# Patient Record
Sex: Female | Born: 1995 | Race: Black or African American | Hispanic: No | Marital: Single | Smoking: Never smoker
Health system: Southern US, Community
[De-identification: ages and names within clinical notes are randomized; demographics above are authoritative.]

---

## 2016-01-29 ENCOUNTER — Encounter (HOSPITAL_COMMUNITY): Payer: Self-pay | Admitting: Emergency Medicine

## 2016-01-29 ENCOUNTER — Emergency Department (HOSPITAL_COMMUNITY)
Admission: EM | Admit: 2016-01-29 | Discharge: 2016-01-29 | Disposition: A | Discharge status: Home / Self Care | Payer: Self-pay | Attending: Emergency Medicine | Admitting: Emergency Medicine

## 2016-01-29 ENCOUNTER — Emergency Department (HOSPITAL_COMMUNITY): Payer: Self-pay

## 2016-01-29 DIAGNOSIS — G43109 Migraine with aura, not intractable, without status migrainosus: Secondary | ICD-10-CM

## 2016-01-29 DIAGNOSIS — G43809 Other migraine, not intractable, without status migrainosus: Secondary | ICD-10-CM | POA: Insufficient documentation

## 2016-01-29 LAB — I-STAT CHEM 8, ED
BUN: 11 mg/dL (ref 6–20)
CHLORIDE: 106 mmol/L (ref 101–111)
Calcium, Ion: 1.17 mmol/L (ref 1.15–1.40)
Creatinine, Ser: 0.7 mg/dL (ref 0.44–1.00)
Glucose, Bld: 96 mg/dL (ref 65–99)
HEMATOCRIT: 40 % (ref 36.0–46.0)
Hemoglobin: 13.6 g/dL (ref 12.0–15.0)
Potassium: 4.3 mmol/L (ref 3.5–5.1)
SODIUM: 142 mmol/L (ref 135–145)
TCO2: 23 mmol/L (ref 0–100)

## 2016-01-29 LAB — PREGNANCY, URINE: Preg Test, Ur: NEGATIVE

## 2016-01-29 MED ORDER — DIPHENHYDRAMINE HCL 50 MG/ML IJ SOLN
25.0000 mg | Freq: Once | INTRAMUSCULAR | Status: AC
  Administered 2016-01-29: 25 mg via INTRAVENOUS
  Filled 2016-01-29: qty 1

## 2016-01-29 MED ORDER — ONDANSETRON 4 MG PO TBDP: 4.0000 mg | ORAL_TABLET | Freq: Three times a day (TID) | ORAL | 0 refills | Status: AC | PRN

## 2016-01-29 MED ORDER — PROCHLORPERAZINE EDISYLATE 5 MG/ML IJ SOLN
10.0000 mg | Freq: Once | INTRAMUSCULAR | Status: AC
  Administered 2016-01-29: 10 mg via INTRAVENOUS
  Filled 2016-01-29: qty 2

## 2016-01-29 MED ORDER — DEXAMETHASONE SODIUM PHOSPHATE 10 MG/ML IJ SOLN
10.0000 mg | Freq: Once | INTRAMUSCULAR | Status: AC
  Administered 2016-01-29: 10 mg via INTRAVENOUS
  Filled 2016-01-29: qty 1

## 2016-01-29 MED ORDER — SODIUM CHLORIDE 0.9 % IV BOLUS (SEPSIS)
1000.0000 mL | Freq: Once | INTRAVENOUS | Status: AC
  Administered 2016-01-29: 1000 mL via INTRAVENOUS

## 2016-01-29 MED ORDER — IBUPROFEN 400 MG PO TABS: 400.0000 mg | ORAL_TABLET | Freq: Four times a day (QID) | ORAL | 0 refills | Status: AC | PRN

## 2016-01-29 NOTE — ED Triage Notes (Signed)
Pt. Stated, I started having headache this morning on the right side of my face.

## 2016-01-29 NOTE — ED Provider Notes (Signed)
[facility]-EMERGENCY DEPT Provider Note   CSN: [id number] Arrival date & time: 01/29/16  1101     History   Chief Complaint Chief Complaint  Patient presents with  . Headache  . Facial Pain    HPI Susan Rush is a 20 y.o. female.  Patient is 20 yo F with no PMH, no history of prior migraines, presenting with severe right-sided headache with right ocular pain starting at 9:00 AM. Also reports photophobia and "spots" preceding headache, but no vision changes. Symptoms have been constant throughout the day, and she tried taking OTC generic pain medicine but vomited it up. She denies ear pain, tinnitus, dizziness, numbness, weakness, or difficulty ambulating. No recent fevers, chills, neck pain, or stiffness. Denies any alcohol or drug use.      History reviewed. No pertinent past medical history.  There are no active problems to display for this patient.   History reviewed. No pertinent surgical history.  OB History    No data available       Home Medications    Prior to Admission medications   Medication Sig Start Date End Date Taking? Authorizing Provider  Acetaminophen (PAIN RELIEF PO) Take 1 tablet by mouth as needed (for pain or headache).   Yes Historical Provider, MD    Family History No family history on file.  Social History Social History  Substance Use Topics  . Smoking status: Never Smoker  . Smokeless tobacco: Never Used  . Alcohol use No     Allergies   Patient has no known allergies.   Review of Systems Review of Systems  Constitutional: Negative for chills and fever.  HENT: Negative for ear pain, sore throat and tinnitus.   Eyes: Positive for photophobia. Negative for pain and visual disturbance.  Respiratory: Negative for cough and shortness of breath.   Cardiovascular: Negative for chest pain, palpitations and leg swelling.  Gastrointestinal: Positive for vomiting. Negative for abdominal pain and blood in stool.  Genitourinary: Negative  for dysuria, flank pain and hematuria.  Musculoskeletal: Negative for back pain, neck pain and neck stiffness.  Skin: Negative for color change and rash.  Neurological: Positive for headaches. Negative for dizziness, seizures, syncope, weakness and numbness.     Physical Exam Updated Vital Signs BP 111/65 (BP Location: Right Arm)   Pulse 75   Temp 98.1 F (36.7 C) (Oral)   Resp 17   Ht 5' (1.524 m)   Wt 95 kg   SpO2 100%   BMI 40.92 kg/m   Physical Exam  Constitutional: She appears well-developed and well-nourished. No distress.  HENT:  Head: Normocephalic and atraumatic.  Mouth/Throat: Oropharynx is clear and moist.  TMs and ear canals normal bilaterally. No nasal mucosal edema or erythema. No frontal or maxillary sinus tenderness. No oropharyngeal exudate, erythema, or edema.  Eyes: Conjunctivae and EOM are normal.  Dilated pupils noted bilaterally, but PERRL.  Neck: Normal range of motion. Neck supple.  No nuchal rigidity or meningeal signs.  Cardiovascular: Normal rate, regular rhythm, normal heart sounds and intact distal pulses.   Pulmonary/Chest: Effort normal and breath sounds normal. No respiratory distress.  Abdominal: Soft. There is no tenderness.  Musculoskeletal: Normal range of motion. She exhibits no edema or tenderness.  Neurological: She is alert.  Speech is clear and goal oriented, follows commands. Cranial nerves III - XII without deficit, no facial droop. Normal strength in upper and lower extremities bilaterally, strong and equal grip strength. Sensation normal to light and sharp touch. Moves extremities  without ataxia, coordination intact. Normal finger to nose and rapid alternating movements. Neg Romberg, no pronator drift. Normal gait.  Skin: Skin is warm and dry.  Psychiatric: She has a normal mood and affect.  Nursing note and vitals reviewed.    ED Treatments / Results  Labs (all labs ordered are listed, but only abnormal results are  displayed) Labs Reviewed  PREGNANCY, URINE  I-STAT CHEM 8, ED    EKG  EKG Interpretation None       Radiology Ct Head Wo Contrast  Result Date: 01/29/2016 CLINICAL DATA:  Right-sided headache. EXAM: CT HEAD WITHOUT CONTRAST TECHNIQUE: Contiguous axial images were obtained from the base of the skull through the vertex without intravenous contrast. COMPARISON:  None. FINDINGS: Brain: No evidence of acute infarction, hemorrhage, hydrocephalus, extra-axial collection or mass lesion/mass effect. Vascular: No hyperdense vessel or unexpected calcification. Skull: Normal. Negative for fracture or focal lesion. Sinuses/Orbits: No acute finding. Other: None. IMPRESSION: No acute abnormalities to explain the patient's pain. Electronically Signed   By: Gerome Samavid  Williams III M.D   On: 01/29/2016 13:44    Procedures Procedures (including critical care time)  Medications Ordered in ED Medications  sodium chloride 0.9 % bolus 1,000 mL (0 mLs Intravenous Stopped 01/29/16 1420)  prochlorperazine (COMPAZINE) injection 10 mg (10 mg Intravenous Given 01/29/16 1302)  diphenhydrAMINE (BENADRYL) injection 25 mg (25 mg Intravenous Given 01/29/16 1302)  dexamethasone (DECADRON) injection 10 mg (10 mg Intravenous Given 01/29/16 1302)     Initial Impression / Assessment and Plan / ED Course  I have reviewed the triage vital signs and the nursing notes.  Pertinent labs & imaging results that were available during my care of the patient were reviewed by me and considered in my medical decision making (see chart for details).  Clinical Course    Patient is 20 yo F with no history of prior migraines, presenting with severe right-sided headache with right ocular pain starting at 9:00 AM. Patient is afebrile with no nuchal rigidity concerning for meningitis, and has completely normal neuro exam. Given severity of headache with no history of migraines, CT head ordered to r/o acute pathology including malignancy or  ICH. Patient given IVF, Zofran, and migraine cocktail. CT head showed no acute findings to explain patient's pain. On reassessment, patient's headache resolved. Lkely cluster headache or ocular migraine, and patient stable for d/c home with prescription ibuprofen and Zofran and referral to neurology for reevaluation. Return precautions discussed for new or worsening symptoms including headache, dizziness, vision changes, numbness, weakness, difficulty walking, vomiting, or any concerning neurologic symptoms.  Final Clinical Impressions(s) / ED Diagnoses   Final diagnoses:  Ocular migraine    New Prescriptions Discharge Medication List as of 01/29/2016  2:18 PM    START taking these medications   Details  ibuprofen (ADVIL,MOTRIN) 400 MG tablet Take 1 tablet (400 mg total) by mouth every 6 (six) hours as needed., Starting Sun 01/29/2016, Print    ondansetron (ZOFRAN ODT) 4 MG disintegrating tablet Take 1 tablet (4 mg total) by mouth every 8 (eight) hours as needed for nausea or vomiting., Starting Sun 01/29/2016, Print         Tarissa Kerin F de EnglevaleVillier II, [place] 01/29/16 2127    Maia PlanJoshua G Long, MD 01/30/16 1326

## 2016-01-29 NOTE — Discharge Instructions (Signed)
You likely experienced an ocular migraine today. Please take ibuprofen as needed for relief of your headache, as well as Zofran for nausea. Please call to schedule appointment with Neurology for reevaluation in 2 days or return to ED for new or worsening symptoms.

## 2018-08-27 IMAGING — CT CT HEAD W/O CM
4 series · 17 of 47 positions shown, 19 images · non-contrast
Comparison: None.

CLINICAL DATA: Right-sided headache.

EXAM:
CT HEAD WITHOUT CONTRAST
TECHNIQUE: Contiguous axial images were obtained from the base of the skull
through the vertex without intravenous contrast.

[Series 2: head without · axial · non-contrast · 0.40mm/px · z∈[-116,-1]mm · 7 of 31 slices shown, 9 images]
[im 4/31  brain]
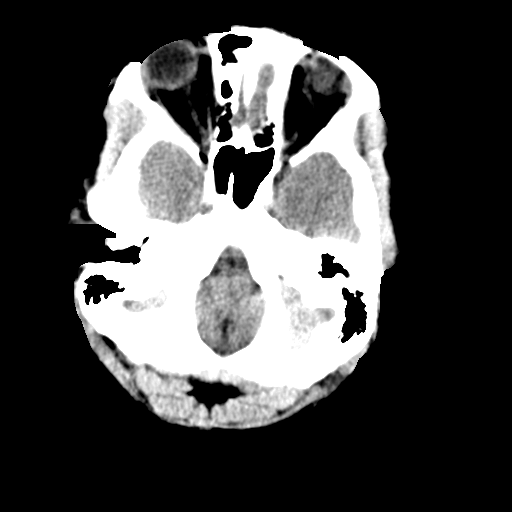
[im 4/31  bone]
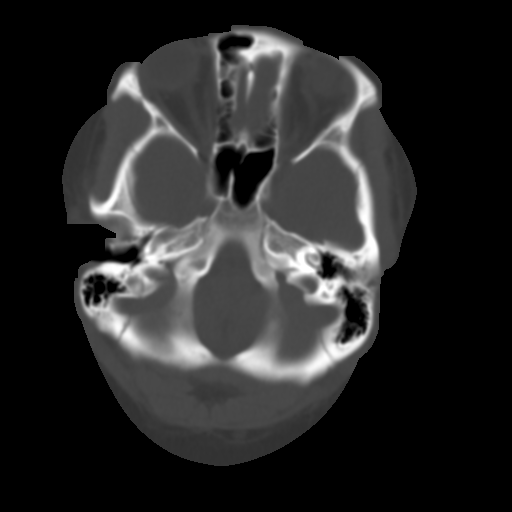
[im 8/31  brain]
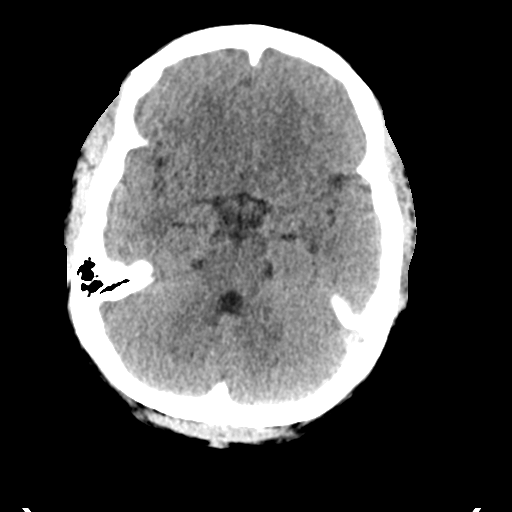
[im 12/31  brain]
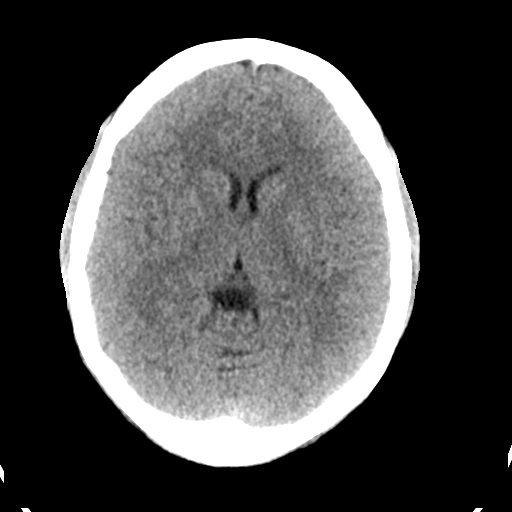
[im 16/31  brain]
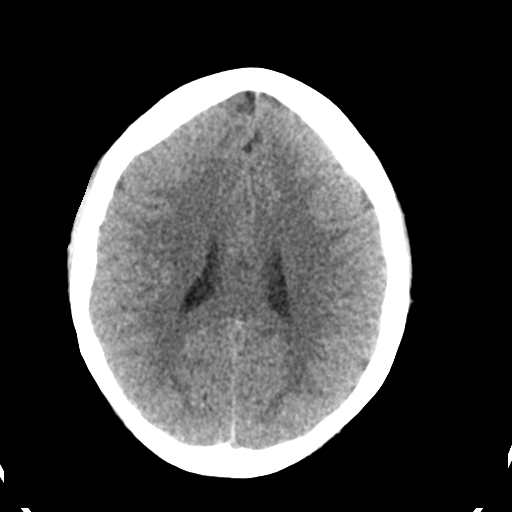
[im 19/31  brain]
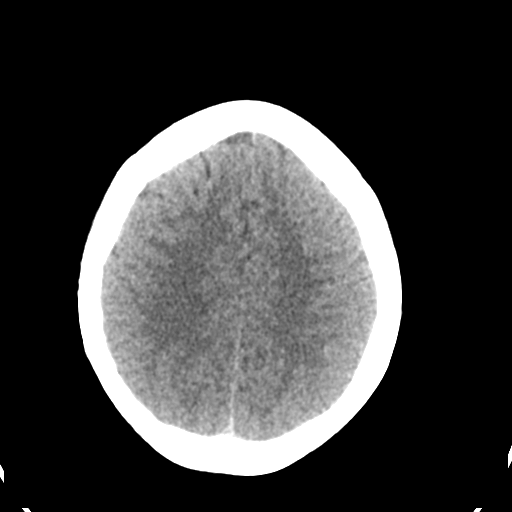
[im 19/31  bone]
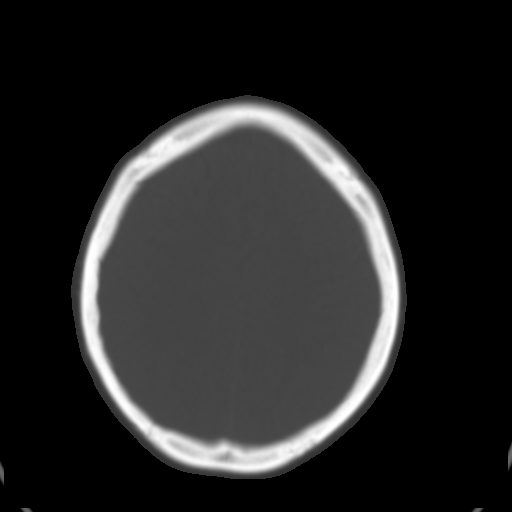
[im 23/31  brain]
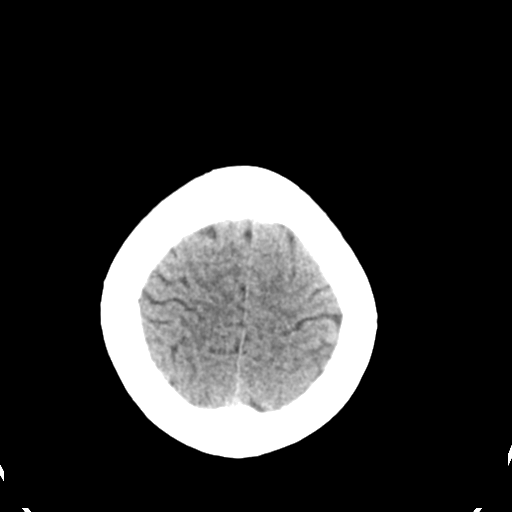
[im 27/31  brain]
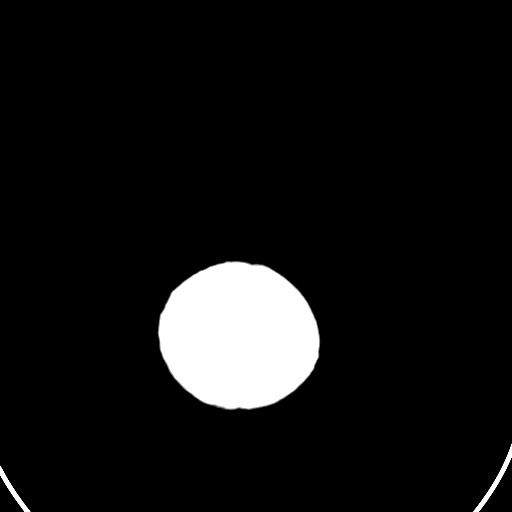

[Series 3: head bone · axial · 0.40mm/px · z∈[-117,-63]mm · 4 of 78 slices shown]
[im 8/78  bone]
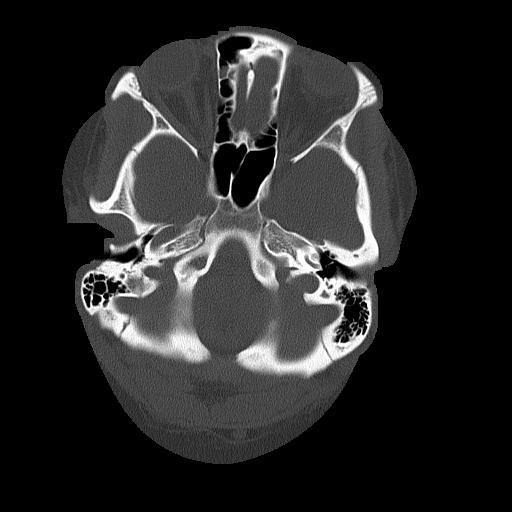
[im 16/78  bone]
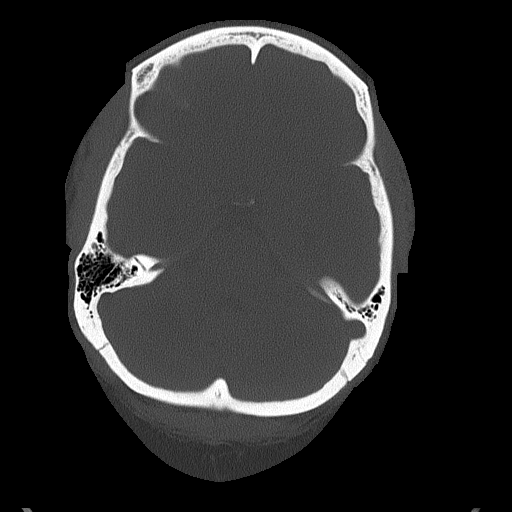
[im 24/78  bone]
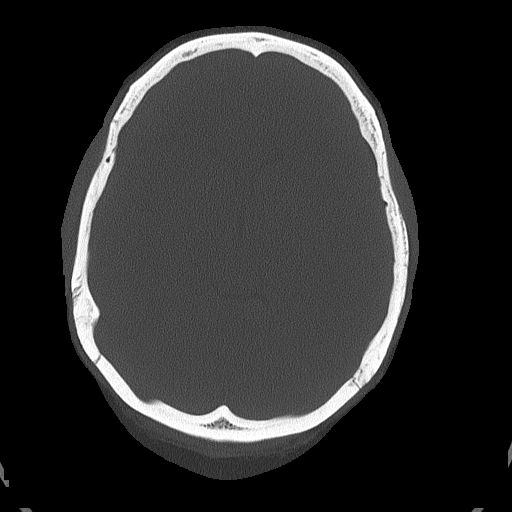
[im 35/78  bone]
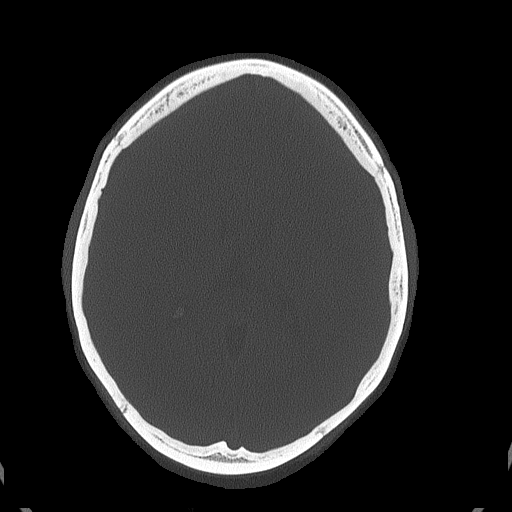

[Series 4: head without cor · coronal · non-contrast · 0.30mm/px · 3 of 67 slices shown]
[im 23/67  brain]
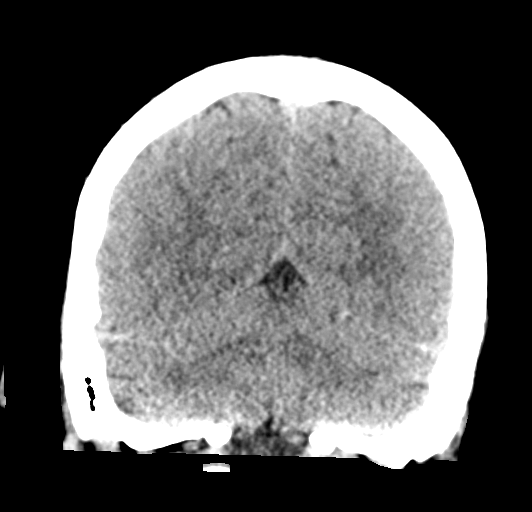
[im 30/67  brain]
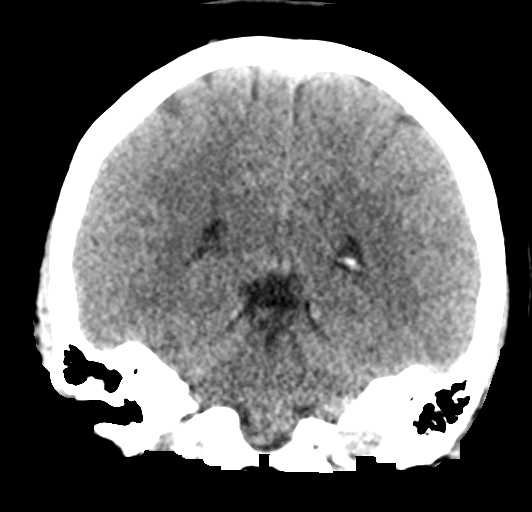
[im 37/67  brain]
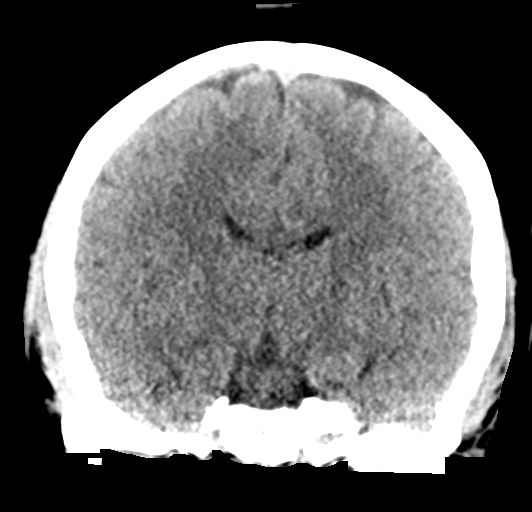

[Series 5: head without sag · sagittal · non-contrast · 0.30mm/px · 3 of 67 slices shown]
[im 23/67  brain]
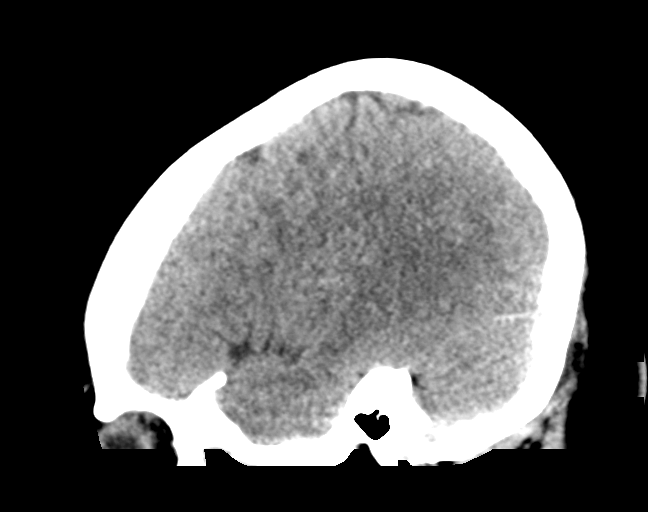
[im 34/67  brain]
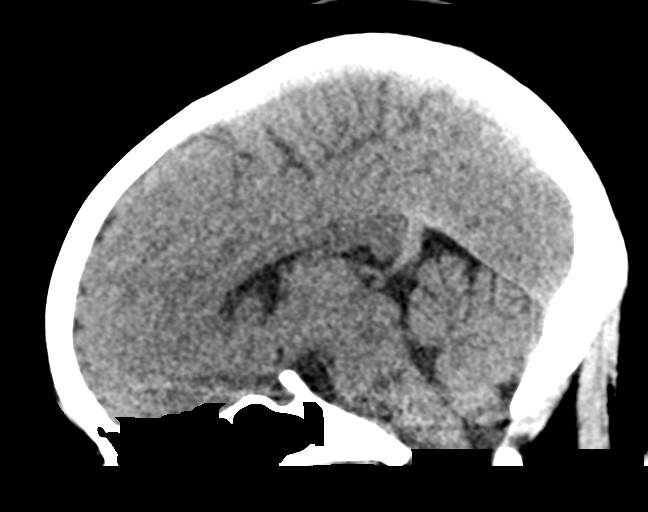
[im 45/67  brain]
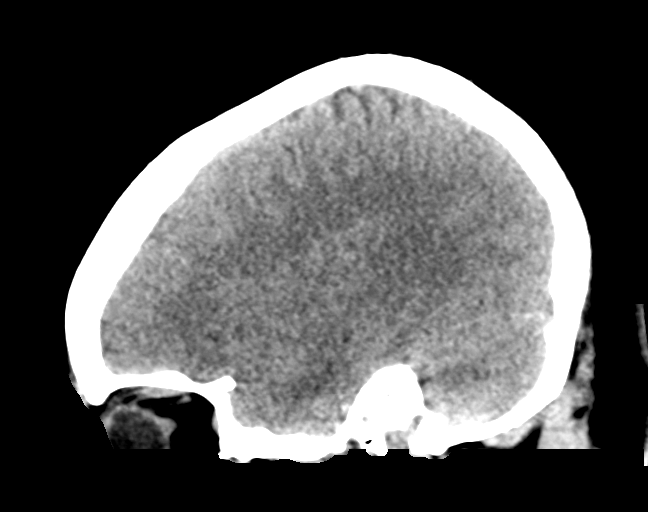

[17 of 47 positions shown; findings below may reference images not displayed]

FINDINGS: Brain: No evidence of acute infarction, hemorrhage, hydrocephalus,
extra-axial collection or mass lesion/mass effect.

Vascular: No hyperdense vessel or unexpected calcification.

Skull: Normal. Negative for fracture or focal lesion.

Sinuses/Orbits: No acute finding.

Other: None.
IMPRESSION: No acute abnormalities to explain the patient's pain.
